# Patient Record
Sex: Male | Born: 1990 | Race: Black or African American | Hispanic: No | Marital: Single | Smoking: Former smoker
Health system: Southern US, Community
[De-identification: ages and names within clinical notes are randomized; demographics above are authoritative.]

---
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
## 2009-04-27 ENCOUNTER — Ambulatory Visit: Payer: Self-pay | Admitting: Internal Medicine

## 2009-05-17 ENCOUNTER — Telehealth (INDEPENDENT_AMBULATORY_CARE_PROVIDER_SITE_OTHER): Payer: Self-pay | Admitting: Internal Medicine

## 2009-05-17 LAB — CONVERTED CEMR LAB
Cholesterol: 217 mg/dL — ABNORMAL HIGH (ref 0–169)
LDL Cholesterol: 121 mg/dL — ABNORMAL HIGH (ref 0–109)
Triglycerides: 99 mg/dL (ref <150)

## 2009-05-20 ENCOUNTER — Ambulatory Visit: Payer: Self-pay | Admitting: Internal Medicine

## 2009-05-20 LAB — CONVERTED CEMR LAB: GC Probe Amp, Urine: NEGATIVE

## 2009-06-08 ENCOUNTER — Telehealth (INDEPENDENT_AMBULATORY_CARE_PROVIDER_SITE_OTHER): Payer: Self-pay | Admitting: Internal Medicine

## 2010-09-19 ENCOUNTER — Emergency Department (HOSPITAL_COMMUNITY)
Admission: EM | Admit: 2010-09-19 | Discharge: 2010-09-19 | Payer: Self-pay | Source: Home / Self Care | Admitting: Emergency Medicine

## 2011-06-26 LAB — GC/CHLAMYDIA PROBE AMP, GENITAL: Chlamydia, DNA Probe: POSITIVE — AB

## 2011-09-26 ENCOUNTER — Encounter: Payer: Self-pay | Admitting: *Deleted

## 2011-09-26 ENCOUNTER — Emergency Department (HOSPITAL_COMMUNITY)
Admission: EM | Admit: 2011-09-26 | Discharge: 2011-09-26 | Disposition: A | Discharge status: Home / Self Care | Payer: Self-pay | Attending: Emergency Medicine | Admitting: Emergency Medicine

## 2011-09-26 DIAGNOSIS — R6883 Chills (without fever): Secondary | ICD-10-CM | POA: Insufficient documentation

## 2011-09-26 DIAGNOSIS — F17200 Nicotine dependence, unspecified, uncomplicated: Secondary | ICD-10-CM | POA: Insufficient documentation

## 2011-09-26 DIAGNOSIS — R109 Unspecified abdominal pain: Secondary | ICD-10-CM | POA: Insufficient documentation

## 2011-09-26 DIAGNOSIS — R112 Nausea with vomiting, unspecified: Secondary | ICD-10-CM | POA: Insufficient documentation

## 2011-09-26 DIAGNOSIS — R10816 Epigastric abdominal tenderness: Secondary | ICD-10-CM | POA: Insufficient documentation

## 2011-09-26 MED ORDER — SODIUM CHLORIDE 0.9 % IV BOLUS (SEPSIS)
1000.0000 mL | Freq: Once | INTRAVENOUS | Status: AC
  Administered 2011-09-26: 1000 mL via INTRAVENOUS

## 2011-09-26 MED ORDER — ONDANSETRON HCL 4 MG/2ML IJ SOLN
4.0000 mg | Freq: Once | INTRAMUSCULAR | Status: AC
  Administered 2011-09-26: 4 mg via INTRAVENOUS
  Filled 2011-09-26: qty 2

## 2011-09-26 NOTE — ED Notes (Signed)
Pt reports genrealized abdominal pain, nausea, vomiting after drinking 1 mixed liquor drink last night.

## 2011-09-26 NOTE — ED Notes (Signed)
Pt is here with abd pain, chills, and vomiting.  Symptoms started at 0500.  Pt is having upper abd pain.  Pt is actively vomiting

## 2011-09-26 NOTE — ED Provider Notes (Signed)
History     CSN: [id number]  Arrival date & time 09/26/11  1106   First MD Initiated Contact with Patient 09/26/11 1224      Chief Complaint  Patient presents with  . Emesis    etoh  . Abdominal Pain    (Consider location/radiation/quality/duration/timing/severity/associated sxs/prior treatment) HPI History obtained from the patient. He states that he was drinking alcohol last evening; states that he only had one large mixed liquor drink. He began to experience abdominal pain, chills, vomiting this morning around 5 AM. His pain is located in the upper abdomen at the epigastrium. He has had recurrent vomiting during the day today, and estimates that he has vomited approximately 10 times today. He is generally healthy; denies chronic medical problems. Has not taken anything at home for his sx. He ate and drank normally yesterday; he has no recent hx of URI sx.   States he only drinks occasionally, only once every other week.  History reviewed. No pertinent past medical history.  History reviewed. No pertinent past surgical history.  No family history on file.  History  Substance Use Topics  . Smoking status: Current Everyday Smoker  . Smokeless tobacco: Not on file  . Alcohol Use: Yes     occ      Review of Systems  Constitutional: Negative for fever, chills, activity change, appetite change and unexpected weight change.  HENT: Negative.   Respiratory: Negative for cough, chest tightness and shortness of breath.   Cardiovascular: Negative for chest pain and palpitations.  Gastrointestinal: Positive for nausea, vomiting and abdominal pain. Negative for diarrhea.  Musculoskeletal: Negative for myalgias.  Skin: Negative for color change and rash.  Neurological: Negative for dizziness and weakness.    Allergies  Review of patient's allergies indicates no known allergies.  Home Medications  No current outpatient prescriptions on file.  BP 117/65  Pulse 78  Temp(Src)  98 F (36.7 C) (Oral)  Resp 16  SpO2 100%  Physical Exam  Nursing note and vitals reviewed. Constitutional: He is oriented to person, place, and time. He appears well-developed and well-nourished. No distress.  HENT:  Head: Normocephalic and atraumatic.  Eyes: Conjunctivae are normal.  Neck: Normal range of motion.  Cardiovascular: Normal rate and regular rhythm.   Pulmonary/Chest: Effort normal and breath sounds normal.  Abdominal: Soft. Bowel sounds are normal. There is tenderness. There is guarding. There is no rebound.       Moderate TTP and guarding to palpation over epigastrium; no rebound  Neurological: He is alert and oriented to person, place, and time.       mentating appropriately  Skin: Skin is warm and dry. He is not diaphoretic.    ED Course  Procedures (including critical care time)   Labs Reviewed  LIPASE, BLOOD   No results found.   1. Nausea & vomiting       MDM  As the pt was having epigastric tenderness with exam and had had recent etoh consumption, lipase was sent; this was nl. He felt much better with 1L of fluid and Zofran and vitals normalized. He was d/ced home. Return precautions discussed. Pt verbalized understanding and agreed to plan.       Grant Fontana, [place] 09/26/11 [phone]

## 2011-09-28 NOTE — ED Provider Notes (Signed)
History/physical exam/procedure(s) were performed by non-physician practitioner and as supervising physician I was immediately available for consultation/collaboration. I have reviewed all notes and am in agreement with care and plan.   Kenosha Doster S Thora Scherman, MD 09/28/11 1237 

## 2011-12-07 ENCOUNTER — Encounter (HOSPITAL_COMMUNITY): Payer: Self-pay | Admitting: Emergency Medicine

## 2011-12-07 ENCOUNTER — Emergency Department (HOSPITAL_COMMUNITY)
Admission: EM | Admit: 2011-12-07 | Discharge: 2011-12-07 | Disposition: A | Discharge status: Home / Self Care | Payer: Self-pay | Attending: Emergency Medicine | Admitting: Emergency Medicine

## 2011-12-07 DIAGNOSIS — H5789 Other specified disorders of eye and adnexa: Secondary | ICD-10-CM | POA: Insufficient documentation

## 2011-12-07 DIAGNOSIS — H547 Unspecified visual loss: Secondary | ICD-10-CM | POA: Insufficient documentation

## 2011-12-07 DIAGNOSIS — H109 Unspecified conjunctivitis: Secondary | ICD-10-CM | POA: Insufficient documentation

## 2011-12-07 DIAGNOSIS — H53149 Visual discomfort, unspecified: Secondary | ICD-10-CM | POA: Insufficient documentation

## 2011-12-07 DIAGNOSIS — H579 Unspecified disorder of eye and adnexa: Secondary | ICD-10-CM | POA: Insufficient documentation

## 2011-12-07 MED ORDER — FLUORESCEIN SODIUM 1 MG OP STRP
1.0000 | ORAL_STRIP | Freq: Once | OPHTHALMIC | Status: AC
  Administered 2011-12-07: 1 via OPHTHALMIC
  Filled 2011-12-07: qty 1

## 2011-12-07 MED ORDER — NAPHAZOLINE-PHENIRAMINE 0.025-0.3 % OP SOLN: 1.0000 [drp] | OPHTHALMIC | Status: AC | PRN

## 2011-12-07 MED ORDER — TETRACAINE HCL 0.5 % OP SOLN
1.0000 [drp] | Freq: Once | OPHTHALMIC | Status: AC
  Administered 2011-12-07: 1 [drp] via OPHTHALMIC
  Filled 2011-12-07: qty 2

## 2011-12-07 NOTE — ED Provider Notes (Signed)
History     CSN: [id number]  Arrival date & time 12/07/11  2007   First MD Initiated Contact with Patient 12/07/11 2235      Chief Complaint  Patient presents with  . Conjunctivitis    (Consider location/radiation/quality/duration/timing/severity/associated sxs/prior treatment) Patient is a 21 y.o. male presenting with conjunctivitis. The history is provided by the patient.  Conjunctivitis  The current episode started yesterday. The problem occurs continuously. The problem has been gradually worsening. The problem is moderate. The symptoms are relieved by nothing. The symptoms are aggravated by light and movement. Associated symptoms include decreased vision, eye itching, photophobia, eye discharge (Clear tearing, eye crusting in morning ) and eye redness. Pertinent negatives include no orthopnea, no fever, no double vision, no abdominal pain, no constipation, no diarrhea, no nausea, no vomiting, no congestion, no ear discharge, no ear pain, no headaches, no hearing loss, no mouth sores, no rhinorrhea, no sore throat, no stridor, no swollen glands, no muscle aches, no neck pain, no neck stiffness, no cough, no URI, no rash, no diaper rash and no eye pain. The eye pain is mild. There is pain in the right eye. The eye pain is not associated with movement. The eyelid exhibits no abnormality. He has been behaving normally. He has been eating and drinking normally. Urine output has been normal. The last void occurred less than 6 hours ago. Sick contacts: girlfriend had pink eye in both eyes last week.     History reviewed. No pertinent past medical history.  No past surgical history on file.  No family history on file.  History  Substance Use Topics  . Smoking status: Current Everyday Smoker  . Smokeless tobacco: Not on file  . Alcohol Use: Yes     occ      Review of Systems  Constitutional: Negative for fever.  HENT: Negative for hearing loss, ear pain, congestion, sore throat,  rhinorrhea, mouth sores, neck pain and ear discharge.   Eyes: Positive for photophobia, discharge (Clear tearing, eye crusting in morning ), redness and itching. Negative for double vision and pain.  Respiratory: Negative for cough and stridor.   Cardiovascular: Negative for orthopnea.  Gastrointestinal: Negative for nausea, vomiting, abdominal pain, diarrhea and constipation.  Skin: Negative for rash.  Neurological: Negative for headaches.  All other systems reviewed and are negative.    Allergies  Review of patient's allergies indicates no known allergies.  Home Medications   Current Outpatient Rx  Name Route Sig Dispense Refill  . IBUPROFEN 200 MG PO TABS Oral Take 400 mg by mouth every 8 (eight) hours as needed. For pain.    Marland Kitchen PRESCRIPTION MEDICATION Right Eye Place 1 drop into the right eye 2 (two) times daily. Girlfriend's eye drop for her case of conjunctivitis from last week.  Was unsure of name, thinks it begins with "Pol".      BP 136/63  Pulse 82  Temp(Src) 98.8 F (37.1 C) (Oral)  Resp 16  SpO2 100%  Physical Exam  Nursing note and vitals reviewed. Constitutional: He is oriented to person, place, and time. He appears well-developed and well-nourished. No distress.  HENT:  Head: Normocephalic and atraumatic.  Eyes: Conjunctivae and EOM are normal. Pupils are equal, round, and reactive to light. No foreign body present in the left eye.       No tenderness to palpation over temporal arteries or orbital region. Pain free EOMs, visual acuity equal bilaterally, no increase in IOPs, no proptosis, lid swelling, hyphema, purulent  discharge from eyes, or consensual photophobia.  Eyelids everted, no evidence of FB.  Fluorescein study: no corneal uptake, dendritic pattern, or evidence of corneal abrasions.  Neck: Normal range of motion. Neck supple.  Pulmonary/Chest: Effort normal.  Neurological: He is alert and oriented to person, place, and time.  Skin: Skin is warm and  dry. No rash noted. He is not diaphoretic.  Psychiatric: His behavior is normal.    ED Course  Procedures (including critical care time)  Labs Reviewed - No data to display No results found.   No diagnosis found.    MDM  Viral conjunctivitis   Pt dx likely viral conjunctivitis based on presentation & eye exam, no antibiotics are indicated.  No evidence of HSV or VSV infection. Pt is not a contact lens wearer.  Exam non-concerning for orbital cellulitis, hyphema, corneal ulcers, corneal abrasions or trauma.  Patient will be discharged home with naphazoline drops every one to 2 hours for pruritus.  Patient has been instructed to use cool compresses and practice personal hygiene with frequent handwashing.  Patient understands to followup with ophthalmology, especially if new symptoms including change in vision, purulent drainage, or entrapment occur.           Jaci Carrel, [place] 12/07/11 2332

## 2011-12-07 NOTE — ED Notes (Signed)
Pt with drainage from R eye. States his girlfriend had pink eye last week.

## 2011-12-07 NOTE — Discharge Instructions (Signed)
Listed in your discharge instructions is an opthalmologist (eye doctor) to schedule a follow up appointment with. Read the instructions below and make sure you understand reasons to return to the Emergency Department.   Conjunctivitis  Conjunctivitis is commonly called "pink eye." Conjunctivitis can be caused by bacterial or viral infection, allergies, or injuries. There is usually redness of the lining of the eye, itching, discomfort, and sometimes discharge. Pink eye is very contagious and spreads by direct contact.  You may be given antibiotic eyedrops as part of your treatment. Before using your eye medicine, remove all drainage from the eye by washing gently with warm water and cotton balls. Continue to use the medication until you have awakened 2 mornings in a row without discharge from the eye. Do not rub your eye. This increases the irritation and helps spread infection. Use separate towels from other household members. Wash your hands with soap and water before and after touching your eyes. Use cold compresses to reduce pain and sunglasses to relieve irritation from light. Do not wear contact lenses or wear eye makeup until the infection is gone.  SEEK MEDICAL CARE IF:  Your symptoms are not better after 3 days of treatment.  You have increased pain or trouble seeing.  The outer eyelids become very red or swollen.  You develop double vision or your vision becomes blurred or worsens in any way.  You have trouble moving your eyes.  The eye looks like it is popping out (proptosis).  You develop a severe headache, severe neck pain, or neck stiffness.  You develop repeated vomiting.  You have a fever or persistent symptoms for more than 72 hours.  You have a fever and your symptoms suddenly get worse.    RESOURCE GUIDE  Dental Problems  Patients with Medicaid: [facility]                     [facility] [address]                                            [address] Phone:  [phone]                                                  Phone:  [phone]  If unable to pay or uninsured, contact:  [facility] or [facility] to become qualified for the adult dental clinic.  Chronic Pain Problems Contact  [facility]  [phone] Patients need to be referred by their primary care doctor.  Insufficient Money for Medicine Contact [employer]:  call "211" or [facility] 271-5999.  No Primary Care Doctor Call [facility]  832-8000 Other agencies that provide inexpensive medical care    [facility]  832-8035    [facility]  832-7272    [facility]  [phone]    Women[facility] Clinic  832-4777    [facility]  [phone]    [facility]  272-1050  Psychological Services [facility]  [phone] [facility]  [phone] [facility]   800 853-5163 (emergency services 641-4993)  Substance Abuse Resources [facility]  336-882-2125   [facility] 336-784-9470 [facility] 336-285-9073 [facility] 336-845-3988 Residential & Outpatient Substance Abuse Program  800-659-3381  Abuse/Neglect [place] Child Abuse Hotline (336) 641-3795 [place] [facility] Abuse Hotline 800-378-5315 (After Hours)  Emergency Shelter [facility] (336) 271-5985  [facility] (336) 275-9566 [facility] (704) 372-4663  MRSA Hotline #:   832-7006    [place] Resources  [facility] [place]     [employer]                          [facility] [address] [place]                       [address]      [address]  [place]                                                [place]                            [place] Phone:  [phone]                                   Phone:   [phone]                 Phone:  [phone]  [facility] Phone:  [phone]  [place] Child Abuse Hotline (336) 342-1394 (336) 342-3537 (After Hours)     

## 2011-12-08 NOTE — ED Provider Notes (Signed)
Medical screening examination/treatment/procedure(s) were performed by non-physician practitioner and as supervising physician I was immediately available for consultation/collaboration.  Hurman Horn, MD 12/08/11 [phone]

## 2012-08-22 ENCOUNTER — Emergency Department (HOSPITAL_COMMUNITY)
Admission: EM | Admit: 2012-08-22 | Discharge: 2012-08-22 | Disposition: A | Discharge status: Home / Self Care | Payer: Self-pay | Attending: Emergency Medicine | Admitting: Emergency Medicine

## 2012-08-22 ENCOUNTER — Encounter (HOSPITAL_COMMUNITY): Payer: Self-pay | Admitting: *Deleted

## 2012-08-22 DIAGNOSIS — F17200 Nicotine dependence, unspecified, uncomplicated: Secondary | ICD-10-CM | POA: Insufficient documentation

## 2012-08-22 DIAGNOSIS — Z23 Encounter for immunization: Secondary | ICD-10-CM | POA: Insufficient documentation

## 2012-08-22 DIAGNOSIS — Y929 Unspecified place or not applicable: Secondary | ICD-10-CM | POA: Insufficient documentation

## 2012-08-22 DIAGNOSIS — Y9389 Activity, other specified: Secondary | ICD-10-CM | POA: Insufficient documentation

## 2012-08-22 DIAGNOSIS — S61419A Laceration without foreign body of unspecified hand, initial encounter: Secondary | ICD-10-CM

## 2012-08-22 DIAGNOSIS — S61209A Unspecified open wound of unspecified finger without damage to nail, initial encounter: Secondary | ICD-10-CM | POA: Insufficient documentation

## 2012-08-22 DIAGNOSIS — W268XXA Contact with other sharp object(s), not elsewhere classified, initial encounter: Secondary | ICD-10-CM | POA: Insufficient documentation

## 2012-08-22 MED ORDER — TETANUS-DIPHTH-ACELL PERTUSSIS 5-2.5-18.5 LF-MCG/0.5 IM SUSP
0.5000 mL | Freq: Once | INTRAMUSCULAR | Status: AC
  Administered 2012-08-22: 0.5 mL via INTRAMUSCULAR
  Filled 2012-08-22: qty 0.5

## 2012-08-22 NOTE — ED Provider Notes (Signed)
History  This chart was scribed for Benny Lennert, MD by Erskine Emery, ED Scribe. This patient was seen in room TR08C/TR08C and the patient's care was started at 16:47.   CSN: [id number]  Arrival date & time 08/22/12  1638   First MD Initiated Contact with Patient 08/22/12 1647      Chief Complaint  Patient presents with  . Laceration    (Consider location/radiation/quality/duration/timing/severity/associated sxs/prior Treatment) Marcus Zhang is a 21 y.o. male who presents to the Emergency Department complaining of a laceration to the right thumb after cutting it on the edge of a vegetable can. Pt has not yet been able to control the bleeding. Pt does not know when his last Tetanus shot was. Patient is a 21 y.o. male presenting with skin laceration. The history is provided by the patient. No language interpreter was used.  Laceration  The incident occurred less than 1 hour ago. Pain location: right thumb. The laceration mechanism was a a metal edge (a vegetable can). The pain is moderate. The pain has been constant since onset. He reports no foreign bodies present. His tetanus status is unknown.    History reviewed. No pertinent past medical history.  History reviewed. No pertinent past surgical history.  History reviewed. No pertinent family history.  History  Substance Use Topics  . Smoking status: Current Every Day Smoker  . Smokeless tobacco: Not on file  . Alcohol Use: Yes     Comment: occ      Review of Systems  Constitutional: Negative for fever and chills.  Respiratory: Negative for shortness of breath.   Gastrointestinal: Negative for nausea and vomiting.  Skin: Positive for wound (right thumb).  Neurological: Negative for weakness.    Allergies  Review of patient's allergies indicates no known allergies.  Home Medications  No current outpatient prescriptions on file.  BP 148/98  Pulse 88  Temp 98 F (36.7 C) (Oral)  Resp 20  SpO2  98%  Physical Exam  Nursing note and vitals reviewed. Constitutional: He is oriented to person, place, and time. He appears well-developed.  HENT:  Head: Normocephalic and atraumatic.  Eyes: Conjunctivae normal are normal.  Neck: No tracheal deviation present.  Cardiovascular:  No murmur heard. Pulmonary/Chest: Effort normal. He exhibits no tenderness.  Musculoskeletal: Normal range of motion.  Neurological: He is oriented to person, place, and time.  Skin: Skin is warm.       2cm laceration to ventral distal right thumb.  Psychiatric: He has a normal mood and affect.    ED Course  Procedures (including critical care time) DIAGNOSTIC STUDIES: Oxygen Saturation is 98% on room air, normal by my interpretation.    COORDINATION OF CARE: 16:50--I evaluated the patient and we discussed a treatment plan including laceration repair and antibiotics to which the pt agreed.  17:05--I performed laceration repair LACERATION REPAIR PROCEDURE NOTE The patient's identification was confirmed and consent was obtained. This procedure was performed by Benny Lennert, MD at 5:05 PM. Site: right thumb Sterile procedures observed: yes Anesthetic used (type and amt): 2% without epi Suture type/size:4-0 nylon Length: 2 cm # of Sutures: 3 Technique: interrupted Complexity: simple Tetanus UTD or ordered: Tetanus ordered Site anesthetized, irrigated with NS, explored without evidence of foreign body, wound well approximated, site covered with dry, sterile dressing.  Patient tolerated procedure well without complications. Instructions for care discussed verbally and patient provided with additional written instructions for homecare and f/u.  I notified the pt that he needs  to keep the sutures in for over a week and then he can see his PCP, urgent care, or came back here to have them removed. I told him we would be discharging him with antibiotics to make sure the area doesn't get  infected.    Labs Reviewed - No data to display No results found.   No diagnosis found.    MDM  The chart was scribed for me under my direct supervision.  I personally performed the history, physical, and medical decision making and all procedures in the evaluation of this patient.Benny Lennert, MD 08/22/12 [phone]

## 2012-08-22 NOTE — ED Notes (Signed)
Patient injured his right thumb.  Laceration to the right thumb from a vegetable can

## 2014-02-02 ENCOUNTER — Emergency Department (HOSPITAL_COMMUNITY): Payer: Self-pay

## 2014-02-02 ENCOUNTER — Emergency Department (HOSPITAL_COMMUNITY)
Admission: EM | Admit: 2014-02-02 | Discharge: 2014-02-02 | Disposition: A | Discharge status: Home / Self Care | Payer: Self-pay | Attending: Emergency Medicine | Admitting: Emergency Medicine

## 2014-02-02 ENCOUNTER — Encounter (HOSPITAL_COMMUNITY): Payer: Self-pay | Admitting: Emergency Medicine

## 2014-02-02 DIAGNOSIS — F17200 Nicotine dependence, unspecified, uncomplicated: Secondary | ICD-10-CM | POA: Insufficient documentation

## 2014-02-02 DIAGNOSIS — Y9389 Activity, other specified: Secondary | ICD-10-CM | POA: Insufficient documentation

## 2014-02-02 DIAGNOSIS — Y929 Unspecified place or not applicable: Secondary | ICD-10-CM | POA: Insufficient documentation

## 2014-02-02 DIAGNOSIS — S60221A Contusion of right hand, initial encounter: Secondary | ICD-10-CM

## 2014-02-02 DIAGNOSIS — W2209XA Striking against other stationary object, initial encounter: Secondary | ICD-10-CM | POA: Insufficient documentation

## 2014-02-02 DIAGNOSIS — S60229A Contusion of unspecified hand, initial encounter: Secondary | ICD-10-CM | POA: Insufficient documentation

## 2014-02-02 MED ORDER — IBUPROFEN 600 MG PO TABS: 600.0000 mg | ORAL_TABLET | Freq: Four times a day (QID) | ORAL | Status: DC | PRN

## 2014-02-02 MED ORDER — HYDROCODONE-ACETAMINOPHEN 5-325 MG PO TABS: 1.0000 | ORAL_TABLET | ORAL | Status: DC | PRN

## 2014-02-02 NOTE — ED Notes (Signed)
Pt was "horsing around" with girlfriend last night when he injured R hand in process. Presents today with painful and swollen R hand.

## 2014-02-02 NOTE — Discharge Instructions (Signed)
Apply ice to the area, elevate and take the medication as directed. Do not take the narcotic if you are driving as it will make you sleepy.

## 2014-02-02 NOTE — ED Provider Notes (Signed)
CSN: [id number]     Arrival date & time 02/02/14  1448 History  This chart was scribed for non-physician practitioner, Kerrie BuffaloHope Neese, FNP,working with Donnetta HutchingBrian Cook, MD, by Karle PlumberJennifer Tensley, ED Scribe.  This patient was seen in room APFT23/APFT23 and the patient's care was started at 3:06 PM.  Chief Complaint  Patient presents with  . Hand Pain   The history is provided by the patient. No language interpreter was used.   HPI Comments:  Marcus Zhang is a 23 y.o. male who presents to the Emergency Department complaining of sharp shooting pain to his right hand that started approximately 15 hours ago secondary to smashing it on the corner of a wall while playing around. He reports the dorsal aspect of his hand started swelling immediately and upon waking this morning he had pain that radiated to his wrist. Pt states he has neither taken anything for pain nor iced or elevated the hand. He denies numbness, tingling, or any other issues.   History reviewed. No pertinent past medical history. History reviewed. No pertinent past surgical history. History reviewed. No pertinent family history. History  Substance Use Topics  . Smoking status: Current Every Day Smoker  . Smokeless tobacco: Not on file  . Alcohol Use: Yes     Comment: occ    Review of Systems  Musculoskeletal: Arthralgias: right hand.  Neurological: Negative for numbness.  All other systems reviewed and are negative.   Allergies  Review of patient's allergies indicates no known allergies.  Home Medications   Prior to Admission medications   Not on File   Triage Vitals: BP 131/74  Pulse 73  Resp 18  Ht 5\' 7"  (1.702 m)  Wt 230 lb (104.327 kg)  BMI 36.01 kg/m2  SpO2 100% Physical Exam  Nursing note and vitals reviewed. Constitutional: He is oriented to person, place, and time. He appears well-developed and well-nourished.  Eyes: EOM are normal.  Neck: Neck supple.  Cardiovascular:  Good radial pulse.   Pulmonary/Chest: Effort normal.  Abdominal: Soft. There is no tenderness.  Musculoskeletal: Normal range of motion. He exhibits edema and tenderness.  Swelling to dorsal aspect at base of middle finger of right hand.  Neurological: He is alert and oriented to person, place, and time. No cranial nerve deficit.  Skin: Skin is warm and dry.    ED Course  Procedures (including critical care time) DIAGNOSTIC STUDIES: Oxygen Saturation is 100% on RA, normal by my interpretation.   COORDINATION OF CARE: 3:09 PM- Will X-Ray right hand. Pt verbalizes understanding and agrees to plan.  Medications - No data to display  Labs Review Labs Reviewed - No data to display  Imaging Review Dg Hand Complete Right  02/02/2014   CLINICAL DATA:  RIGHT hand pain and swelling posteriorly near middle finger, injury, hit a wall last night  EXAM: RIGHT HAND - COMPLETE 3+ VIEW  COMPARISON:  None  FINDINGS: Soft tissue swelling dorsally overlying the metacarpals and MCP joints.  Osseous mineralization normal.  Joint spaces preserved.  No acute fracture, dislocation or bone destruction.  IMPRESSION: No acute osseous abnormalities.   Electronically Signed   By: Ulyses SouthwardMark  Boles M.D.   On: 02/02/2014 15:33    MDM  23 y.o. male with swelling and tenderness to the right hand s/p injury last night. Ace wrap applied, ice, elevation and pain management. He will return for any problems. I have reviewed this patient's vital signs, nurses notes, imaging and discussed findings and plan of care  with the patient. He voices understanding and agrees to plan.       [facility] Orlene OchM Neese, [place] 02/02/14 [phone]

## 2014-02-03 NOTE — ED Provider Notes (Signed)
Medical screening examination/treatment/procedure(s) were performed by non-physician practitioner and as supervising physician I was immediately available for consultation/collaboration.   EKG Interpretation None        Dandra Velardi W Safa Derner, MD 02/03/14 1238 

## 2014-03-31 ENCOUNTER — Encounter (HOSPITAL_COMMUNITY): Payer: Self-pay | Admitting: Emergency Medicine

## 2014-03-31 ENCOUNTER — Emergency Department (HOSPITAL_COMMUNITY)
Admission: EM | Admit: 2014-03-31 | Discharge: 2014-03-31 | Disposition: A | Discharge status: Home / Self Care | Payer: Self-pay | Attending: Emergency Medicine | Admitting: Emergency Medicine

## 2014-03-31 ENCOUNTER — Emergency Department (HOSPITAL_COMMUNITY): Payer: Self-pay

## 2014-03-31 DIAGNOSIS — W230XXA Caught, crushed, jammed, or pinched between moving objects, initial encounter: Secondary | ICD-10-CM | POA: Insufficient documentation

## 2014-03-31 DIAGNOSIS — Y929 Unspecified place or not applicable: Secondary | ICD-10-CM | POA: Insufficient documentation

## 2014-03-31 DIAGNOSIS — S62609A Fracture of unspecified phalanx of unspecified finger, initial encounter for closed fracture: Secondary | ICD-10-CM | POA: Insufficient documentation

## 2014-03-31 DIAGNOSIS — F17200 Nicotine dependence, unspecified, uncomplicated: Secondary | ICD-10-CM | POA: Insufficient documentation

## 2014-03-31 DIAGNOSIS — S62605A Fracture of unspecified phalanx of left ring finger, initial encounter for closed fracture: Secondary | ICD-10-CM

## 2014-03-31 DIAGNOSIS — Y9383 Activity, rough housing and horseplay: Secondary | ICD-10-CM | POA: Insufficient documentation

## 2014-03-31 MED ORDER — HYDROCODONE-ACETAMINOPHEN 5-325 MG PO TABS: 1.0000 | ORAL_TABLET | Freq: Four times a day (QID) | ORAL | Status: DC | PRN

## 2014-03-31 MED ORDER — IBUPROFEN 800 MG PO TABS
800.0000 mg | ORAL_TABLET | Freq: Once | ORAL | Status: AC
  Administered 2014-03-31: 800 mg via ORAL
  Filled 2014-03-31: qty 1

## 2014-03-31 MED ORDER — IBUPROFEN 800 MG PO TABS: 800.0000 mg | ORAL_TABLET | Freq: Three times a day (TID) | ORAL | Status: DC | PRN

## 2014-03-31 NOTE — Progress Notes (Signed)
[facility] did not get to see patient but will be sending information about [facility] Orange Card program to help patient establish primary care, using the address provided.  °

## 2014-03-31 NOTE — ED Provider Notes (Signed)
CSN: [id number]     Arrival date & time 03/31/14  0808 History   First MD Initiated Contact with Patient 03/31/14 [phone]     Chief Complaint  Patient presents with  . Finger Injury     (Consider location/radiation/quality/duration/timing/severity/associated sxs/prior Treatment) Patient is a 23 y.o. male presenting with hand injury. The history is provided by the patient.  Hand Injury Location:  Finger Injury: yes   Mechanism of injury comment:  Finger caught under deck during some rough-housing Finger location:  L ring finger Pain details:    Quality:  Aching   Radiates to:  Does not radiate   Severity:  Moderate   Onset quality:  Sudden   Timing:  Constant   Progression:  Unchanged Chronicity:  New Handedness:  Right-handed Dislocation: no   Prior injury to area:  No Relieved by:  Nothing Ineffective treatments:  Ice Associated symptoms: no fever     History reviewed. No pertinent past medical history. History reviewed. No pertinent past surgical history. No family history on file. History  Substance Use Topics  . Smoking status: Current Every Day Smoker    Types: Cigarettes  . Smokeless tobacco: Not on file  . Alcohol Use: Yes     Comment: occ    Review of Systems  Constitutional: Negative for fever.  Respiratory: Negative for cough and shortness of breath.   All other systems reviewed and are negative.     Allergies  Review of patient's allergies indicates no known allergies.  Home Medications   Prior to Admission medications   Not on File   BP 149/87  Pulse 86  Temp(Src) 98.1 F (36.7 C) (Oral)  Resp 16  SpO2 100% Physical Exam  Nursing note and vitals reviewed. Constitutional: He is oriented to person, place, and time. He appears well-developed and well-nourished. No distress.  HENT:  Head: Normocephalic and atraumatic.  Mouth/Throat: Oropharynx is clear and moist. No oropharyngeal exudate.  Eyes: EOM are normal. Pupils are equal, round, and  reactive to light.  Neck: Normal range of motion. Neck supple.  Cardiovascular: Normal rate and regular rhythm.  Exam reveals no friction rub.   No murmur heard. Pulmonary/Chest: Effort normal and breath sounds normal. No respiratory distress. He has no wheezes. He has no rales.  Abdominal: He exhibits no distension. There is no tenderness. There is no rebound.  Musculoskeletal: Normal range of motion. He exhibits no edema.       Right hand: He exhibits tenderness (mild, 3rd MCP) and bony tenderness. He exhibits normal range of motion, normal capillary refill, no deformity, no laceration and no swelling. Normal sensation noted. Normal strength noted.       Left hand: He exhibits tenderness and bony tenderness (4th PIP, 4th finger proximal phalanx). He exhibits normal range of motion, normal capillary refill, no deformity and no swelling. Normal sensation noted. Normal strength noted.  Neurological: He is alert and oriented to person, place, and time.  Skin: He is not diaphoretic.    ED Course  Procedures (including critical care time) Labs Review Labs Reviewed - No data to display  Imaging Review Dg Hand Complete Right  03/31/2014   CLINICAL DATA:  Injury of the right hand 1 month ago with persistent pain centered in the third metacarpophalangeal joint  EXAM: RIGHT HAND - COMPLETE 3+ VIEW  COMPARISON:  None.  FINDINGS: The bones of the right hand are adequately mineralized. There is no acute or healing fracture. Specific attention to the third ray reveals  no bony abnormality. There is soft tissue swelling over the metacarpal region.  IMPRESSION: There is no evidence of an acute or healing fracture of the right hand. There is mild persists soft tissue swelling over the metacarpal regions dorsally.   Electronically Signed   By: David  SwazilandJordan   On: 03/31/2014 09:13   Dg Finger Ring Left  03/31/2014   CLINICAL DATA:  Pain post trauma  EXAM: LEFT FOURTH FINGER 2+V  COMPARISON:  None.  FINDINGS:  Frontal, oblique, and lateral views were obtained. There is a small avulsion along the volar aspect of the fourth PIP joint, probably arising from the proximal portion of the fourth middle phalanx. No other evidence of fracture. No dislocation. There is soft tissue swelling. There is no appreciable joint space narrowing.  IMPRESSION: Small avulsion in the volar aspect of the fourth PIP joint. No other evidence of fracture. No dislocation. No appreciable arthropathy.   Electronically Signed   By: Bretta BangWilliam  Woodruff M.D.   On: 03/31/2014 09:14     EKG Interpretation None      MDM   Final diagnoses:  Fracture of fourth finger, left, closed, initial encounter    23 year old male presents with a finger injury. Happened all he is playing with his kids on the deck. He thinks his left ring finger got caught underneath a deck. No laceration. Pain is not controlled with ice. He has full range of motion of his left index finger with good sensation good cap refill. It does hurt with flexion and extension. No deformity or dislocation. Will x-ray. Likely strain versus sprain. Also having right hand pain. Injured his right third finger one month ago, was seen at a dependent that time. CT vertebral plana with that also. No deformity or laceration. Good cap refill good range of motion of all fingers. All fingers are vastly intact. Likely strain also. Will x-ray him.  Patient unable to go to work today, will give work note. L 4th finger xray with small avulsion fx - splinted, will give note for light duty, Hand surgery f/u. Stable for discharge. I have reviewed all labs and imaging and considered them in my medical decision making.   Dagmar HaitWilliam Zakiya Sporrer, MD 03/31/14 [phone]

## 2014-03-31 NOTE — Discharge Instructions (Signed)

## 2014-03-31 NOTE — ED Notes (Signed)
Pt was playing around his siblings yesterday and hurt his ring finger on the deck. Pt states that after icing it yesterday the swelling has gone down but still sore. Pt can flex his finger. 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 Pt c/o pain and swelling on top of right hand.  Pt states that he injured it about a month ago and was [facility].

## 2014-04-07 ENCOUNTER — Encounter (HOSPITAL_COMMUNITY): Payer: Self-pay | Admitting: Emergency Medicine

## 2014-04-07 ENCOUNTER — Emergency Department (HOSPITAL_COMMUNITY)
Admission: EM | Admit: 2014-04-07 | Discharge: 2014-04-07 | Disposition: A | Discharge status: Home / Self Care | Payer: Self-pay | Attending: Emergency Medicine | Admitting: Emergency Medicine

## 2014-04-07 DIAGNOSIS — S6992XD Unspecified injury of left wrist, hand and finger(s), subsequent encounter: Secondary | ICD-10-CM

## 2014-04-07 DIAGNOSIS — Z8781 Personal history of (healed) traumatic fracture: Secondary | ICD-10-CM | POA: Insufficient documentation

## 2014-04-07 DIAGNOSIS — Z79899 Other long term (current) drug therapy: Secondary | ICD-10-CM | POA: Insufficient documentation

## 2014-04-07 DIAGNOSIS — M79609 Pain in unspecified limb: Secondary | ICD-10-CM | POA: Insufficient documentation

## 2014-04-07 DIAGNOSIS — F17200 Nicotine dependence, unspecified, uncomplicated: Secondary | ICD-10-CM | POA: Insufficient documentation

## 2014-04-07 DIAGNOSIS — Z0289 Encounter for other administrative examinations: Secondary | ICD-10-CM | POA: Insufficient documentation

## 2014-04-07 NOTE — Progress Notes (Signed)
[facility] provided pt with a list of primary care resources and a [facility] [employer] Card application to help patient establish primary care.  °

## 2014-04-07 NOTE — ED Provider Notes (Signed)
CSN: [id number]     Arrival date & time 04/07/14  1344 History  This chart was scribed for non-physician practitioner Fayrene HelperBowie Kaedin Hicklin, working with Toy BakerAnthony T Allen, MD by Carl Bestelina Holson, ED Scribe. This patient was seen in room WTR5/WTR5 and the patient's care was started at 2:49 PM.    Chief Complaint  Patient presents with  . Finger Injury    requesting note to return to work    The history is provided by the patient. No language interpreter was used.   HPI Comments: Marcus Zhang is a 23 y.o. male who presents to the Emergency Department needing a note to clear him for work.  The patient has a history of fracture to his left ring finger a week ago while playing with his little sister.  [facility] and was diagnosed with a small fracture to his left ring finger.  He states that he used a splint for one week and did not follow-up with an orthopedist even though he was provided with a referral for one.  He states that he was placed on light duty for 10 days at work.  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  Pt is here stating he feels much better, with minimal pain and wants to return to work with full duty.     History reviewed. No pertinent past medical history. History reviewed. No pertinent past surgical history. History reviewed. No pertinent family history. History  Substance Use Topics  . Smoking status: Current Every Day Smoker    Types: Cigarettes  . Smokeless tobacco: Not on file  . Alcohol Use: Yes     Comment: occ    Review of Systems  Musculoskeletal: Negative for arthralgias and joint swelling.  Skin: Negative for wound.  All other systems reviewed and are negative.     Allergies  Review of patient's allergies indicates no known allergies.  Home Medications   Prior to Admission medications   Medication Sig Start Date End Date Taking? Authorizing Provider  HYDROcodone-acetaminophen (NORCO/VICODIN) 5-325 MG per tablet  Take 1 tablet by mouth every 6 (six) hours as needed for moderate pain. 03/31/14   Dagmar HaitWilliam Blair Walden, MD  ibuprofen (ADVIL,MOTRIN) 800 MG tablet Take 1 tablet (800 mg total) by mouth every 8 (eight) hours as needed for moderate pain. 03/31/14   Dagmar HaitWilliam Blair Walden, MD   Triage Vitals: BP 102/90  Pulse 69  Temp(Src) 98.1 F (36.7 C) (Oral)  Resp 16  SpO2 100%  Physical Exam  Nursing note and vitals reviewed. Constitutional: He is oriented to person, place, and time. He appears well-developed and well-nourished.  HENT:  Head: Normocephalic and atraumatic.  Eyes: EOM are normal.  Neck: Normal range of motion.  Cardiovascular: Normal rate.   Pulmonary/Chest: Effort normal.  Musculoskeletal: Normal range of motion.  Left hand ring finger: mild tenderness to the PIP on palpation.  Full range of motion through the finger at all joints with a normal grip.  Sensation intact.  Brisk capillary refill.   Neurological: He is alert and oriented to person, place, and time.  Skin: Skin is warm and dry.  Psychiatric: He has a normal mood and affect. His behavior is normal.    ED Course  Procedures (including critical care time)  DIAGNOSTIC STUDIES: Oxygen Saturation is 100% on room air, normal by my interpretation.    COORDINATION OF CARE: 2:53 PM- Discussed discharging the patient with a note clearing him to  work and the patient agreed to the treatment plan.   Labs Review Labs Reviewed - No data to display  Imaging Review No results found.   EKG Interpretation None      MDM   Final diagnoses:  Injury of left ring finger, subsequent encounter    BP 102/90  Pulse 69  Temp(Src) 98.1 F (36.7 C) (Oral)  Resp 16  SpO2 100%   I personally performed the services described in this documentation, which was scribed in my presence. The recorded information has been reviewed and is accurate.     Fayrene Helper, PA-C 04/07/14 1456

## 2014-04-07 NOTE — ED Notes (Addendum)
Hx of fracture of ring finger of l/hand. Used splint for one week. Pt is requesting a return to work note, clearing him for work. Pt was referred to ortho, but did not follow up

## 2014-04-07 NOTE — Discharge Instructions (Signed)
You may resume your normal work duty.  Follow up with hand specialist as needed.

## 2014-04-08 NOTE — ED Provider Notes (Signed)
Medical screening examination/treatment/procedure(s) were performed by non-physician practitioner and as supervising physician I was immediately available for consultation/collaboration.   EKG Interpretation None       Toy BakerAnthony T Alonie Gazzola, MD 04/08/14 1510

## 2019-08-17 ENCOUNTER — Ambulatory Visit (INDEPENDENT_AMBULATORY_CARE_PROVIDER_SITE_OTHER): Payer: Self-pay

## 2019-08-17 ENCOUNTER — Ambulatory Visit (HOSPITAL_COMMUNITY)
Admission: EM | Admit: 2019-08-17 | Discharge: 2019-08-17 | Disposition: A | Discharge status: Home / Self Care | Payer: Self-pay | Attending: Family Medicine | Admitting: Family Medicine

## 2019-08-17 ENCOUNTER — Encounter (HOSPITAL_COMMUNITY): Payer: Self-pay

## 2019-08-17 ENCOUNTER — Other Ambulatory Visit: Payer: Self-pay

## 2019-08-17 DIAGNOSIS — S161XXA Strain of muscle, fascia and tendon at neck level, initial encounter: Secondary | ICD-10-CM
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
## 2019-08-17 NOTE — Discharge Instructions (Addendum)
May use muscle relaxant that I gave his partner
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
## 2019-11-13 ENCOUNTER — Encounter (HOSPITAL_COMMUNITY): Payer: Self-pay

## 2019-11-13 ENCOUNTER — Other Ambulatory Visit: Payer: Self-pay

## 2019-11-13 ENCOUNTER — Emergency Department (HOSPITAL_COMMUNITY)
Admission: EM | Admit: 2019-11-13 | Discharge: 2019-11-13 | Disposition: A | Discharge status: Home / Self Care | Payer: Self-pay | Attending: Emergency Medicine | Admitting: Emergency Medicine

## 2019-11-13 DIAGNOSIS — Y929 Unspecified place or not applicable: Secondary | ICD-10-CM | POA: Insufficient documentation

## 2019-11-13 DIAGNOSIS — Z87891 Personal history of nicotine dependence: Secondary | ICD-10-CM | POA: Insufficient documentation

## 2019-11-13 DIAGNOSIS — Y9389 Activity, other specified: Secondary | ICD-10-CM | POA: Insufficient documentation

## 2019-11-13 DIAGNOSIS — W504XXA Accidental scratch by another person, initial encounter: Secondary | ICD-10-CM | POA: Insufficient documentation

## 2019-11-13 DIAGNOSIS — Y999 Unspecified external cause status: Secondary | ICD-10-CM | POA: Insufficient documentation

## 2019-11-13 DIAGNOSIS — S3994XA Unspecified injury of external genitals, initial encounter: Secondary | ICD-10-CM | POA: Insufficient documentation

## 2019-11-13 MED ORDER — BACITRACIN ZINC 500 UNIT/GM EX OINT
1.0000 "application " | TOPICAL_OINTMENT | Freq: Once | CUTANEOUS | Status: AC
  Administered 2019-11-13: 1 via TOPICAL
  Filled 2019-11-13: qty 0.9

## 2019-11-13 NOTE — ED Triage Notes (Signed)
Patient arrived stating that on Saturday night he was having intercourse and had a cut on the tip of his penis. Reporting minimal pain at the time but is now having drainage from site.
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
## 2019-11-13 NOTE — Discharge Instructions (Signed)
Can continue using bacitracin on the area.  Refrain from sexual activity until area is fully healed. You will be contacted with results of STD testing if positive. Return here for any new/acute changes.

## 2019-11-13 NOTE — ED Notes (Signed)
PT DISCHARGED. INSTRUCTIONS GIVEN. AAOX4. PT IN NO APPARENT DISTRESS OR PAIN. THE OPPORTUNITY TO ASK QUESTIONS WAS PROVIDED. 

## 2019-11-14 LAB — GC/CHLAMYDIA PROBE AMP (~~LOC~~) NOT AT ARMC
Chlamydia: NEGATIVE
Neisseria Gonorrhea: POSITIVE — AB

## 2019-11-18 ENCOUNTER — Telehealth (HOSPITAL_COMMUNITY): Payer: Self-pay

## 2020-02-19 ENCOUNTER — Encounter (HOSPITAL_COMMUNITY): Payer: Self-pay | Admitting: Emergency Medicine

## 2020-02-19 ENCOUNTER — Other Ambulatory Visit: Payer: Self-pay

## 2020-02-19 ENCOUNTER — Emergency Department (HOSPITAL_COMMUNITY)
Admission: EM | Admit: 2020-02-19 | Discharge: 2020-02-20 | Disposition: A | Discharge status: Home / Self Care | Payer: Self-pay | Attending: Emergency Medicine | Admitting: Emergency Medicine

## 2020-02-19 DIAGNOSIS — Z79899 Other long term (current) drug therapy: Secondary | ICD-10-CM | POA: Insufficient documentation

## 2020-02-19 DIAGNOSIS — Z87891 Personal history of nicotine dependence: Secondary | ICD-10-CM | POA: Insufficient documentation

## 2020-02-19 DIAGNOSIS — H6692 Otitis media, unspecified, left ear: Secondary | ICD-10-CM | POA: Insufficient documentation

## 2020-02-19 MED ORDER — ACETAMINOPHEN 500 MG PO TABS
1000.0000 mg | ORAL_TABLET | Freq: Once | ORAL | Status: AC
  Administered 2020-02-20: 1000 mg via ORAL
  Filled 2020-02-19: qty 2

## 2020-02-19 MED ORDER — AMOXICILLIN 500 MG PO CAPS
500.0000 mg | ORAL_CAPSULE | Freq: Once | ORAL | Status: AC
  Administered 2020-02-19: 500 mg via ORAL
  Filled 2020-02-19: qty 1

## 2020-02-19 NOTE — ED Triage Notes (Addendum)
Patient arrives to ED with complaints of left ear pain since Friday. Patient states he feels a lot of pressure and feels like it needs to pop. Patient states he is now having headaches because of the pain.

## 2020-02-19 NOTE — ED Provider Notes (Signed)
[facility] EMERGENCY DEPARTMENT Provider Note   CSN: [id number] Arrival date & time: 02/19/20  2108     History Chief Complaint  Patient presents with  . Ear Pain  . Headache    Marcus Zhang is a 29 y.o. male.  The history is provided by the patient and medical records.  Headache Associated symptoms: ear pain     29 y.o. M here with 6 days of left sided ear pain.  States symptoms have been worsening since onset.  He reports associated left sided headache, muffled hearing, and sensation of swelling inside the ear.  Denies swimming or other head submersion.  No fevers.  No meds tried PTA.  History reviewed. No pertinent past medical history.  Patient Active Problem List   Diagnosis Date Noted  . WEIGHT LOSS 01/09/2008  . OBESITY 06/16/2007  . TOBACCO ABUSE 06/16/2007  . BEHAVIOR PROBLEM 06/16/2007  . Pure hypercholesterolemia 12/14/2006  . MARIJUANA ABUSE 12/14/2006    History reviewed. No pertinent surgical history.     Family History  Problem Relation Age of Onset  . Hypertension Mother   . Healthy Father     Social History   Tobacco Use  . Smoking status: Former Smoker    Types: Cigarettes    Quit date: 08/16/2016    Years since quitting: 3.5  . Smokeless tobacco: Never Used  Substance Use Topics  . Alcohol use: Yes    Comment: occ  . Drug use: No    Home Medications Prior to Admission medications   Medication Sig Start Date End Date Taking? Authorizing Provider  HYDROcodone-acetaminophen (NORCO/VICODIN) 5-325 MG per tablet Take 1 tablet by mouth every 6 (six) hours as needed for moderate pain.    [provider]  ibuprofen (ADVIL,MOTRIN) 800 MG tablet Take 800 mg by mouth every 8 (eight) hours as needed for moderate pain.    [provider]    Allergies    Patient has no known allergies.  Review of Systems   Review of Systems  HENT: Positive for ear pain.   Neurological: Positive for headaches.  All  other systems reviewed and are negative.   Physical Exam Updated Vital Signs BP 140/88   Pulse 79   Temp 98.6 F (37 C)   Resp 17   Ht 5\' 6"  (1.676 m)   Wt 117.9 kg   SpO2 98%   BMI 41.97 kg/m   Physical Exam Vitals and nursing note reviewed.  Constitutional:      General: He is not in acute distress.    Appearance: He is well-developed. He is not diaphoretic.  HENT:     Head: Normocephalic and atraumatic.     Right Ear: External ear normal.     Left Ear: External ear normal.     Ears:     Comments: Left EAC and TM erythematous, no signs of rupture, no drainage Right ear normal Eyes:     Conjunctiva/sclera: Conjunctivae normal.     Pupils: Pupils are equal, round, and reactive to light.  Neck:     Comments: No rigidity, no meningismus Cardiovascular:     Rate and Rhythm: Normal rate and regular rhythm.     Heart sounds: Normal heart sounds. No murmur.  Pulmonary:     Effort: Pulmonary effort is normal. No respiratory distress.     Breath sounds: Normal breath sounds. No wheezing or rhonchi.  Abdominal:     General: Bowel sounds are normal.  Palpations: Abdomen is soft.     Tenderness: There is no abdominal tenderness. There is no guarding.  Musculoskeletal:        General: Normal range of motion.     Cervical back: Full passive range of motion without pain, normal range of motion and neck supple. No rigidity.  Skin:    General: Skin is warm and dry.     Findings: No rash.  Neurological:     Mental Status: He is alert and oriented to person, place, and time.     Cranial Nerves: No cranial nerve deficit.     Sensory: No sensory deficit.     Motor: No tremor or seizure activity.     Comments: AAOx3, answering questions and following commands appropriately; equal strength UE and LE bilaterally; CN grossly intact; moves all extremities appropriately without ataxia; no focal neuro deficits or facial asymmetry appreciated  Psychiatric:        Behavior: Behavior  normal.        Thought Content: Thought content normal.     ED Results / Procedures / Treatments   Labs (all labs ordered are listed, but only abnormal results are displayed) Labs Reviewed - No data to display  EKG None  Radiology No results found.  Procedures Procedures (including critical care time)  Medications Ordered in ED Medications - No data to display  ED Course  I have reviewed the triage vital signs and the nursing notes.  Pertinent labs & imaging results that were available during my care of the patient were reviewed by me and considered in my medical decision making (see chart for details).    MDM Rules/Calculators/A&P  29 y.o. M here with left ear pain.  Has associated headache and muffled hearing.  He is afebrile, non-toxic.  Has findings of left OM on exam.  Suspect this is causing headache. No focal neurologic deficits or clinical signs of meningitis. Will treat with course of amoxicillin, encouraged tylenol or motrin for pain.  Return here for any new/acute changes.  Final Clinical Impression(s) / ED Diagnoses Final diagnoses:  Acute infection of left ear    Rx / DC Orders ED Discharge Orders         Ordered    amoxicillin (AMOXIL) 500 MG capsule  3 times daily     02/20/20 0000           Garlon Hatchet, PA-C 02/20/20 Belva Crome, MD 02/21/20 [phone]

## 2020-02-20 MED ORDER — AMOXICILLIN 500 MG PO CAPS
500.0000 mg | ORAL_CAPSULE | Freq: Three times a day (TID) | ORAL | 0 refills | Status: DC
Start: 2020-02-20 — End: 2021-01-05

## 2020-02-20 NOTE — Discharge Instructions (Signed)
Remainder of antibiotics sent to your pharmacy, take as directed and make sure to finish the whole course of treatment.  Can take tylenol or motrin for pain. Return to the ED for new or worsening symptoms.

## 2020-10-12 ENCOUNTER — Ambulatory Visit: Admit: 2020-10-12 | Discharge status: Against Medical Advice | Payer: Medicaid Other

## 2020-10-14 ENCOUNTER — Ambulatory Visit: Payer: Self-pay | Admitting: Medical

## 2020-10-20 ENCOUNTER — Telehealth: Payer: Medicaid Other | Admitting: Family Medicine

## 2020-10-25 ENCOUNTER — Ambulatory Visit: Payer: Medicaid Other | Admitting: Family Medicine

## 2021-01-05 ENCOUNTER — Encounter: Payer: Self-pay | Admitting: Family

## 2021-01-05 ENCOUNTER — Telehealth: Payer: Medicaid Other | Admitting: Family

## 2021-01-05 DIAGNOSIS — J301 Allergic rhinitis due to pollen: Secondary | ICD-10-CM

## 2021-01-05 MED ORDER — CETIRIZINE HCL 10 MG PO TABS: 10.0000 mg | ORAL_TABLET | Freq: Every day | ORAL | 11 refills | Status: DC

## 2021-01-05 MED ORDER — FLUTICASONE PROPIONATE 50 MCG/ACT NA SUSP: 2.0000 | Freq: Every day | NASAL | 6 refills | Status: DC

## 2021-01-05 NOTE — Progress Notes (Signed)
   Virtual Visit  Note Due to COVID-19 pandemic this visit was conducted virtually. This visit type was conducted due to national recommendations for restrictions regarding the COVID-19 Pandemic (e.g. social distancing, sheltering in place) in an effort to limit this patient's exposure and mitigate transmission in our community. All issues noted in this document were discussed and addressed.  A physical exam was not performed with this format.  I connected with Marcus Zhang on 01/05/21 at 8:22 Am by video and verified that I am speaking with the correct person using two identifiers. Marcus Zhang is currently located at home and no one is currently with him  during visit. The provider, Jannifer Rodney, FNP is located in their office at time of visit.  I discussed the limitations, risks, security and privacy concerns of performing an evaluation and management service by video and the availability of in person appointments. I also discussed with the patient that there may be a patient responsible charge related to this service. The patient expressed understanding and agreed to proceed.   History and Present Illness:  HPI PT presents today with complaints of rhinorrhea that started 3 weeks ago. He states the skin underneath his nose is "raw" from the drainage. He reports he took benadryl for a few days, but did not feel like it was helping. He has also applied Vaseline to the under his nose, with mild improvement.    Review of Systems  HENT: Positive for congestion. Negative for ear pain and tinnitus.   All other systems reviewed and are negative.    Observations/Objective: No SOB or distress, mild discoloration   Assessment and Plan: 1. Allergic rhinitis due to pollen, unspecified seasonality Start zyrtec and flonase Avoid allergen  Continue to apply Vaseline to skin. Avoid rubbing skin under nose.  - cetirizine (ZYRTEC) 10 MG tablet; Take 1 tablet (10 mg total) by mouth daily.   Dispense: 30 tablet; Refill: 11 - fluticasone (FLONASE) 50 MCG/ACT nasal spray; Place 2 sprays into both nostrils daily.  Dispense: 16 g; Refill: 6     I discussed the assessment and treatment plan with the patient. The patient was provided an opportunity to ask questions and all were answered. The patient agreed with the plan and demonstrated an understanding of the instructions.   The patient was advised to call back or seek an in-person evaluation if the symptoms worsen or if the condition fails to improve as anticipated.  The above assessment and management plan was discussed with the patient. The patient verbalized understanding of and has agreed to the management plan. Patient is aware to call the clinic if symptoms persist or worsen. Patient is aware when to return to the clinic for a follow-up visit. Patient educated on when it is appropriate to go to the emergency department.   Time call ended:  8:30 AM  I provided 8 minutes of   face-to-face time during this encounter.    Jannifer Rodney, FNP
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
## 2021-11-24 ENCOUNTER — Telehealth: Payer: Medicaid Other | Admitting: Physician Assistant

## 2021-11-24 NOTE — Progress Notes (Signed)
The patient no-showed for appointment despite this provider sending direct link, reaching out via phone with no response and waiting for at least 10 minutes from appointment time for patient to join. They will be marked as a NS for this appointment/time.  ? ?Raymont Andreoni Cody Jamario Colina, PA-C ? ? ? ?

## 2021-11-30 NOTE — Progress Notes (Deleted)
? ?  New Patient Office Visit ? ?Subjective:  ?Patient ID: Marcus Zhang, male    DOB: 09/21/1991  Age: 31 y.o. MRN: [id number] ? ?CC: No chief complaint on file. ? ? ?HPI ?[name] presents for new patient visit to establish care.  Introduced to Publishing rights manager role and practice setting.  All questions answered.  Discussed provider/patient relationship and expectations. ? ? ?No past medical history on file. ? ?No past surgical history on file. ? ?Family History  ?Problem Relation Age of Onset  ? Hypertension Mother   ? Healthy Father   ? ? ?Social History  ? ?Socioeconomic History  ? Marital status: Single  ?  Spouse name: Not on file  ? Number of children: Not on file  ? Years of education: Not on file  ? Highest education level: Not on file  ?Occupational History  ? Not on file  ?Tobacco Use  ? Smoking status: Former  ?  Types: Cigarettes  ?  Quit date: 08/16/2016  ?  Years since quitting: 5.2  ? Smokeless tobacco: Never  ?Vaping Use  ? Vaping Use: Never used  ?Substance and Sexual Activity  ? Alcohol use: Yes  ?  Comment: occ  ? Drug use: No  ? Sexual activity: Not on file  ?Other Topics Concern  ? Not on file  ?Social History Narrative  ? Not on file  ? ?Social Determinants of Health  ? ?Financial Resource Strain: Not on file  ?Food Insecurity: Not on file  ?Transportation Needs: Not on file  ?Physical Activity: Not on file  ?Stress: Not on file  ?Social Connections: Not on file  ?Intimate Partner Violence: Not on file  ? ? ?ROS ?Review of Systems ? ?Objective:  ? ?Today's Vitals: There were no vitals taken for this visit. ? ?Physical Exam ? ?Assessment & Plan:  ? ?Problem List Items Addressed This Visit   ?None ? ? ?Outpatient Encounter Medications as of 12/02/2021  ?Medication Sig  ? cetirizine (ZYRTEC) 10 MG tablet Take 1 tablet (10 mg total) by mouth daily.  ? fluticasone (FLONASE) 50 MCG/ACT nasal spray Place 2 sprays into both nostrils daily.  ? ?No facility-administered encounter medications on  file as of 12/02/2021.  ? ? ?Follow-up: No follow-ups on file.  ? ?Gerre Scull, NP ? ?

## 2021-12-02 ENCOUNTER — Telehealth: Payer: Medicaid Other | Admitting: Family Medicine

## 2021-12-02 ENCOUNTER — Ambulatory Visit: Payer: Self-pay | Admitting: Nurse Practitioner

## 2021-12-02 ENCOUNTER — Other Ambulatory Visit: Payer: Self-pay

## 2021-12-02 ENCOUNTER — Telehealth: Payer: Self-pay | Admitting: Nurse Practitioner

## 2021-12-02 DIAGNOSIS — R21 Rash and other nonspecific skin eruption: Secondary | ICD-10-CM

## 2021-12-02 MED ORDER — PREDNISONE 10 MG (21) PO TBPK: ORAL_TABLET | ORAL | 0 refills | Status: DC

## 2021-12-02 MED ORDER — SULFAMETHOXAZOLE-TRIMETHOPRIM 800-160 MG PO TABS: 1.0000 | ORAL_TABLET | Freq: Two times a day (BID) | ORAL | 0 refills | Status: AC

## 2021-12-02 NOTE — Patient Instructions (Signed)
Rash, Adult °A rash is a change in the color of your skin. A rash can also change the way your skin feels. There are many different conditions and factors that can cause a rash. Some rashes may disappear after a few days, but some may last for a few weeks. Common causes of rashes include: °Viral infections, such as: °Colds. °Measles. °Hand, foot, and mouth disease. °Bacterial infections, such as: °Scarlet fever. °Impetigo. °Fungal infections, such as Candida. °Allergic reactions to food, medicines, or skin care products. °Follow these instructions at home: °The goal of treatment is to stop the itching and keep the rash from spreading. Pay attention to any changes in your symptoms. Follow these instructions to help with your condition: °Medicine °Take or apply over-the-counter and prescription medicines only as told by your health care provider. These may include: °Corticosteroid creams to treat red or swollen skin. °Anti-itch lotions. °Oral allergy medicines (antihistamines). °Oral corticosteroids for severe symptoms. ° °Skin care °Apply cool compresses to the affected areas. °Do not scratch or rub your skin. °Avoid covering the rash. Make sure the rash is exposed to air as much as possible. °Managing itching and discomfort °Avoid hot showers or baths, which can make itching worse. A cold shower may help. °Try taking a bath with: °Epsom salts. Follow manufacturer instructions on the packaging. You can get these at your local pharmacy or grocery store. °Baking soda. Pour a small amount into the bath as told by your health care provider. °Colloidal oatmeal. Follow manufacturer instructions on the packaging. You can get this at your local pharmacy or grocery store. °Try applying baking soda paste to your skin. Stir water into baking soda until it reaches a paste-like consistency. °Try applying calamine lotion. This is an over-the-counter lotion that helps to relieve itchiness. °Keep cool and out of the sun. Sweating  and being hot can make itching worse. °General instructions ° °Rest as needed. °Drink enough fluid to keep your urine pale yellow. °Wear loose-fitting clothing. °Avoid scented soaps, detergents, and perfumes. Use gentle soaps, detergents, perfumes, and other cosmetic products. °Avoid any substance that causes your rash. Keep a journal to help track what causes your rash. Write down: °What you eat. °What cosmetic products you use. °What you drink. °What you wear. This includes jewelry. °Keep all follow-up visits as told by your health care provider. This is important. °Contact a health care provider if: °You sweat at night. °You lose weight. °You urinate more than normal. °You urinate less than normal, or you notice that your urine is a darker color than usual. °You feel weak. °You vomit. °Your skin or the whites of your eyes look yellow (jaundice). °Your skin: °Tingles. °Is numb. °Your rash: °Does not go away after several days. °Gets worse. °You are: °Unusually thirsty. °More tired than normal. °You have: °New symptoms. °Pain in your abdomen. °A fever. °Diarrhea. °Get help right away if you: °Have a fever and your symptoms suddenly get worse. °Develop confusion. °Have a severe headache or a stiff neck. °Have severe joint pains or stiffness. °Have a seizure. °Develop a rash that covers all or most of your body. The rash may or may not be painful. °Develop blisters that: °Are on top of the rash. °Grow larger or grow together. °Are painful. °Are inside your nose or mouth. °Develop a rash that: °Looks like purple pinprick-sized spots all over your body. °Has a "bull's eye" or looks like a target. °Is not related to sun exposure, is red and painful, and causes   your skin to peel. °Summary °A rash is a change in the color of your skin. Some rashes disappear after a few days, but some may last for a few weeks. °The goal of treatment is to stop the itching and keep the rash from spreading. °Take or apply over-the-counter  and prescription medicines only as told by your health care provider. °Contact a health care provider if you have new or worsening symptoms. °Keep all follow-up visits as told by your health care provider. This is important. °This information is not intended to replace advice given to you by your health care provider. Make sure you discuss any questions you have with your health care provider. °Document Revised: 01/03/2019 Document Reviewed: 04/15/2018 °Elsevier Patient Education © 2022 [employer] ° °

## 2021-12-02 NOTE — Telephone Encounter (Signed)
I did not send him a No Show letter.  ?

## 2021-12-02 NOTE — Telephone Encounter (Signed)
Pt came in for NP app and I let him know he would be a self pay because he only has Medicaid Family. I informed him he would need to pay and we do not bill. He did reschedule when he would have money 12/09/21. I did call him 2 times and leave a vm and I sent him a mychart message letting him know this info beforehand.  ?

## 2021-12-02 NOTE — Progress Notes (Signed)
?Virtual Visit Consent  ? ?Marcus Zhang, you are scheduled for a virtual visit with a [facility] provider today.   ?  ?Just as with appointments in the office, your consent must be obtained to participate.  Your consent will be active for this visit and any virtual visit you may have with one of our providers in the next 365 days.   ?  ?If you have a MyChart account, a copy of this consent can be sent to you electronically.  All virtual visits are billed to your insurance company just like a traditional visit in the office.   ? ?As this is a virtual visit, video technology does not allow for your provider to perform a traditional examination.  This may limit your provider's ability to fully assess your condition.  If your provider identifies any concerns that need to be evaluated in person or the need to arrange testing (such as labs, EKG, etc.), we will make arrangements to do so.   ?  ?Although advances in technology are sophisticated, we cannot ensure that it will always work on either your end or our end.  If the connection with a video visit is poor, the visit may have to be switched to a telephone visit.  With either a video or telephone visit, we are not always able to ensure that we have a secure connection.    ? ?I need to obtain your verbal consent now.   Are you willing to proceed with your visit today?  ?  ?Marcus Zhang has provided verbal consent on 12/02/2021 for a virtual visit (video or telephone). ?  ?Marcus Zhang  ? ?Date: 12/02/2021 12:59 PM ? ? ?Virtual Visit via Video Note  ? ?Marcus Zhang, connected with  Marcus Zhang  ([id number], 22-Feb-1991) on 12/02/21 at  1:00 PM EST by a video-enabled telemedicine application and verified that I am speaking with the correct person using two identifiers. ? ?Location: ?Patient: Virtual Visit Location Patient: Home ?Provider: Virtual Visit Location Provider: Home Office ?  ?I discussed the limitations of evaluation and management by  telemedicine and the availability of in person appointments. The patient expressed understanding and agreed to proceed.   ? ?History of Present Illness: ?Marcus Zhang is a 31 y.o. who identifies as a male who was assigned male at birth, and is being seen today for rash on body. Nothing new with food or hygiene products or cleaning items. Family that he lives with does not have rash- does not appear contagious due to this. ? ?HPI: Rash ?This is a new problem. The current episode started 1 to 4 weeks ago. The problem has been gradually worsening since onset. The affected locations include the face, neck, left hand and abdomen. The rash is characterized by blistering, draining, itchiness and peeling. He was exposed to nothing. Past treatments include moisturizer. The treatment provided no relief.   ?Problems:  ?Patient Active Problem List  ? Diagnosis Date Noted  ? WEIGHT LOSS 01/09/2008  ? OBESITY 06/16/2007  ? TOBACCO ABUSE 06/16/2007  ? BEHAVIOR PROBLEM 06/16/2007  ? Pure hypercholesterolemia 12/14/2006  ? MARIJUANA ABUSE 12/14/2006  ?  ?Allergies: No Known Allergies ?Medications:  ?Current Outpatient Medications:  ?  cetirizine (ZYRTEC) 10 MG tablet, Take 1 tablet (10 mg total) by mouth daily., Disp: 30 tablet, Rfl: 11 ?  fluticasone (FLONASE) 50 MCG/ACT nasal spray, Place 2 sprays into both nostrils daily., Disp: 16 g, Rfl: 6 ? ?Observations/Objective: ?Patient  is well-developed, well-nourished in no acute distress.  ?Resting comfortably  at home.  ?Head is normocephalic, atraumatic.  ?No labored breathing.  ?Speech is clear and coherent with logical content.  ?Patient is alert and oriented at baseline.  ? ? ?Assessment and Plan: ? ?1. Rash and nonspecific skin eruption ?S&S consistent with questionable impetigo or staph like rash/infection ?Will cover for MRSA as well, as prednisone to calm rash and help with itching ? ?Encouraged to look back and see if allergy to something new ? ?- predniSONE (STERAPRED  UNI-PAK 21 TAB) 10 MG (21) TBPK tablet; Take as directed  Dispense: 21 tablet; Refill: 0 ?- sulfamethoxazole-trimethoprim (BACTRIM DS) 800-160 MG tablet; Take 1 tablet by mouth 2 (two) times daily for 7 days.  Dispense: 14 tablet; Refill: 0 ? ? Reviewed side effects, risks and benefits of medication.   ? ?Patient acknowledged agreement and understanding of the plan.  ? ?Strict in person follow reviewed for worsening or failed improvement with meds. ? ?Follow Up Instructions: ?I discussed the assessment and treatment plan with the patient. The patient was provided an opportunity to ask questions and all were answered. The patient agreed with the plan and demonstrated an understanding of the instructions.  A copy of instructions were sent to the patient via MyChart unless otherwise noted below.  ? ?The patient was advised to call back or seek an in-person evaluation if the symptoms worsen or if the condition fails to improve as anticipated. ? ?Time:  ?I spent 10 minutes with the patient via telehealth technology discussing the above problems/concerns.   ? ?Marcus Zhang ? ?

## 2021-12-09 NOTE — Progress Notes (Deleted)
? ?  New Patient Office Visit ? ?Subjective:  ?Patient ID: Marcus Zhang, male    DOB: 30-Jul-1991  Age: 31 y.o. MRN: [id number] ? ?CC: No chief complaint on file. ? ? ?HPI ?Marcus Zhang presents for new patient visit to establish care.  Introduced to Designer, jewellery role and practice setting.  All questions answered.  Discussed provider/patient relationship and expectations. ? ? ?No past medical history on file. ? ?No past surgical history on file. ? ?Family History  ?Problem Relation Age of Onset  ? Hypertension Mother   ? Healthy Father   ? ? ?Social History  ? ?Socioeconomic History  ? Marital status: Single  ?  Spouse name: Not on file  ? Number of children: Not on file  ? Years of education: Not on file  ? Highest education level: Not on file  ?Occupational History  ? Not on file  ?Tobacco Use  ? Smoking status: Former  ?  Types: Cigarettes  ?  Quit date: 08/16/2016  ?  Years since quitting: 5.3  ? Smokeless tobacco: Never  ?Vaping Use  ? Vaping Use: Never used  ?Substance and Sexual Activity  ? Alcohol use: Yes  ?  Comment: occ  ? Drug use: No  ? Sexual activity: Not on file  ?Other Topics Concern  ? Not on file  ?Social History Narrative  ? Not on file  ? ?Social Determinants of Health  ? ?Financial Resource Strain: Not on file  ?Food Insecurity: Not on file  ?Transportation Needs: Not on file  ?Physical Activity: Not on file  ?Stress: Not on file  ?Social Connections: Not on file  ?Intimate Partner Violence: Not on file  ? ? ?ROS ?Review of Systems ? ?Objective:  ? ?Today's Vitals: There were no vitals taken for this visit. ? ?Physical Exam ? ?Assessment & Plan:  ? ?Problem List Items Addressed This Visit   ?None ? ? ?Outpatient Encounter Medications as of 12/12/2021  ?Medication Sig  ? cetirizine (ZYRTEC) 10 MG tablet Take 1 tablet (10 mg total) by mouth daily.  ? fluticasone (FLONASE) 50 MCG/ACT nasal spray Place 2 sprays into both nostrils daily.  ? predniSONE (STERAPRED UNI-PAK 21 TAB) 10 MG (21)  TBPK tablet Take as directed  ? sulfamethoxazole-trimethoprim (BACTRIM DS) 800-160 MG tablet Take 1 tablet by mouth 2 (two) times daily for 7 days.  ? ?No facility-administered encounter medications on file as of 12/12/2021.  ? ? ?Follow-up: No follow-ups on file.  ? ?Charyl Dancer, NP ? ?

## 2021-12-12 ENCOUNTER — Telehealth: Payer: Self-pay | Admitting: Nurse Practitioner

## 2021-12-12 ENCOUNTER — Ambulatory Visit: Payer: Medicaid Other | Admitting: Nurse Practitioner

## 2021-12-12 NOTE — Telephone Encounter (Signed)
Pt did not show for NP app with Lauren on 12/12/21. I sent a No Show letter. ?

## 2021-12-13 ENCOUNTER — Encounter: Payer: Self-pay | Admitting: Nurse Practitioner

## 2021-12-13 NOTE — Telephone Encounter (Signed)
Pt was no show on 12/12/2021 as well - pt has been dismissed ?

## 2021-12-13 NOTE — Telephone Encounter (Signed)
Noted  

## 2022-03-29 ENCOUNTER — Encounter (HOSPITAL_COMMUNITY): Payer: Self-pay | Admitting: Emergency Medicine

## 2022-03-29 ENCOUNTER — Emergency Department (HOSPITAL_COMMUNITY)
Admission: EM | Admit: 2022-03-29 | Discharge: 2022-03-30 | Disposition: A | Discharge status: Home / Self Care | Payer: Medicaid Other | Attending: Emergency Medicine | Admitting: Emergency Medicine

## 2022-03-29 DIAGNOSIS — R040 Epistaxis: Secondary | ICD-10-CM | POA: Insufficient documentation

## 2022-03-29 LAB — CBC WITH DIFFERENTIAL/PLATELET
Abs Immature Granulocytes: 0.04 10*3/uL (ref 0.00–0.07)
Basophils Absolute: 0.1 10*3/uL (ref 0.0–0.1)
Basophils Relative: 1 %
Eosinophils Absolute: 0.1 10*3/uL (ref 0.0–0.5)
Eosinophils Relative: 1 %
HCT: 46.5 % (ref 39.0–52.0)
Hemoglobin: 14.8 g/dL (ref 13.0–17.0)
Immature Granulocytes: 0 %
Lymphocytes Relative: 24 %
Lymphs Abs: 2.5 10*3/uL (ref 0.7–4.0)
MCH: 29.2 pg (ref 26.0–34.0)
MCHC: 31.8 g/dL (ref 30.0–36.0)
MCV: 91.7 fL (ref 80.0–100.0)
Monocytes Absolute: 0.8 10*3/uL (ref 0.1–1.0)
Monocytes Relative: 8 %
Neutro Abs: 7 10*3/uL (ref 1.7–7.7)
Neutrophils Relative %: 66 %
Platelets: 463 10*3/uL — ABNORMAL HIGH (ref 150–400)
RBC: 5.07 MIL/uL (ref 4.22–5.81)
RDW: 14.3 % (ref 11.5–15.5)
WBC: 10.5 10*3/uL (ref 4.0–10.5)
nRBC: 0 % (ref 0.0–0.2)

## 2022-03-29 NOTE — ED Provider Triage Note (Signed)
Emergency Medicine Provider Triage Evaluation Note  Marcus Zhang , a 31 y.o. male  was evaluated in triage.  Pt complains of nosebleeds and nose pain.  Patient reports he had been using cocaine frequently and stopped about a month ago.  For the past 3 weeks he has been having swelling and pain of the nose with frequent bloody drainage.  No sinus pain or pressure, reports he has felt sick with some chills but has not checked his temperature.  No chest pain, shortness of breath, nausea or vomiting..  Review of Systems  Positive: Nosebleeds, nose pain Negative: Fevers, chest pain, vomiting  Physical Exam  BP (!) 150/103   Pulse 92   Temp 98 F (36.7 C)   Resp 16   Ht 5\' 6"  (1.676 m)   Wt 104.3 kg   SpO2 97%   BMI 37.12 kg/m  Gen:   Awake, no distress   Resp:  Normal effort  MSK:   Moves extremities without difficulty  Other:  Bilateral naris with irritation and erythema, and bloody purulent drainage  Medical Decision Making  Medically screening exam initiated at 11:16 PM.  Appropriate orders placed.  Marcus Zhang was informed that the remainder of the evaluation will be completed by another provider, this initial triage assessment does not replace that evaluation, and the importance of remaining in the ED until their evaluation is complete.     [name], Dartha Lodge 03/29/22 2317

## 2022-03-29 NOTE — ED Triage Notes (Signed)
Pt reports constant nose bleeds X3 weeks.  Reports he stopped using cocaine 1 month ago.

## 2022-03-30 LAB — BASIC METABOLIC PANEL
Anion gap: 11 (ref 5–15)
BUN: 8 mg/dL (ref 6–20)
CO2: 23 mmol/L (ref 22–32)
Calcium: 9.5 mg/dL (ref 8.9–10.3)
Chloride: 106 mmol/L (ref 98–111)
Creatinine, Ser: 0.98 mg/dL (ref 0.61–1.24)
GFR, Estimated: >60 mL/min (ref >60)
Glucose, Bld: 98 mg/dL (ref 70–99)
Potassium: 4.1 mmol/L (ref 3.5–5.1)
Sodium: 140 mmol/L (ref 135–145)

## 2022-03-30 MED ORDER — OXYMETAZOLINE HCL 0.05 % NA SOLN: 2.0000 | Freq: Two times a day (BID) | NASAL | 0 refills | Status: AC

## 2022-03-30 MED ORDER — AMOXICILLIN-POT CLAVULANATE 875-125 MG PO TABS: 1.0000 | ORAL_TABLET | Freq: Two times a day (BID) | ORAL | 0 refills | Status: AC

## 2022-03-30 NOTE — ED Provider Notes (Addendum)
[facility] EMERGENCY DEPARTMENT Provider Note   CSN: [id number] Arrival date & time: 03/29/22  2204     History  Chief Complaint  Patient presents with   Epistaxis    Marcus Zhang is a 31 y.o. male.  31 y.o male with a PMH of cocaine use for 3 years presents to the ED with a chief complaint of bilateral nose drainage x 1 month. Patient reports stopping cocaine use approximately 3 weeks ago, but had been using cocaine every night for 3 years. There is pain around the sinus, worst with palpation. There is constant drainage or mucopurulent drainage mixed with blood. He states symptoms are exacerbate being indoors. Patient has tried applying "eczema cream up his nose". There is no fever, no shortness of breath or other complaints.   The history is provided by the patient.  Epistaxis Location:  Bilateral Severity:  Moderate Duration:  3 weeks Timing:  Constant Progression:  Waxing and waning Chronicity:  New Context: drug use   Context: not anticoagulants, not foreign body, not hypertension, not recent infection and not thrombocytopenia   Relieved by:  Nothing Worsened by:  Movement Ineffective treatments:  None tried Associated symptoms: congestion, facial pain, sinus pain and sneezing   Associated symptoms: no dizziness, no fever and no headaches   Risk factors: no alcohol use and no liver disease        Home Medications Prior to Admission medications   Medication Sig Start Date End Date Taking? Authorizing Provider  amoxicillin-clavulanate (AUGMENTIN) 875-125 MG tablet Take 1 tablet by mouth 2 (two) times daily for 7 days. 03/30/22 04/06/22 Yes Lebaron Bautch, Leonie Douglas, PA-C  oxymetazoline (AFRIN NASAL SPRAY) 0.05 % nasal spray Place 2 sprays into both nostrils 2 (two) times daily for 7 days. 03/30/22 04/06/22 Yes Barbette Mcglaun, Leonie Douglas, PA-C  cetirizine (ZYRTEC) 10 MG tablet Take 1 tablet (10 mg total) by mouth daily. 01/05/21   Junie Spencer, FNP  fluticasone (FLONASE) 50  MCG/ACT nasal spray Place 2 sprays into both nostrils daily. 01/05/21   Junie Spencer, FNP  predniSONE (STERAPRED UNI-PAK 21 TAB) 10 MG (21) TBPK tablet Take as directed 12/02/21   Freddy Finner, NP      Allergies    Patient has no known allergies.    Review of Systems   Review of Systems  Constitutional:  Negative for fever.  HENT:  Positive for congestion, nosebleeds, sinus pain and sneezing.   Respiratory:  Negative for shortness of breath.   Cardiovascular:  Negative for chest pain.  Gastrointestinal:  Negative for abdominal pain.  Genitourinary:  Negative for flank pain.  Neurological:  Negative for dizziness and headaches.  All other systems reviewed and are negative.   Physical Exam Updated Vital Signs BP 129/90 (BP Location: Right Arm)   Pulse 88   Temp 98 F (36.7 C)   Resp 16   Ht 5\' 6"  (1.676 m)   Wt 104.3 kg   SpO2 100%   BMI 37.12 kg/m  Physical Exam Vitals and nursing note reviewed.  Constitutional:      Appearance: Normal appearance. He is not ill-appearing.  HENT:     Head: Normocephalic and atraumatic.     Nose: Congestion present.     Right Nostril: Epistaxis present. No foreign body or occlusion.     Left Nostril: Epistaxis present. No foreign body or occlusion.     Right Turbinates: Enlarged.     Left Turbinates: Enlarged.     Right Sinus: Maxillary  sinus tenderness present.     Left Sinus: Maxillary sinus tenderness present.     Comments: Mucopurulent discharge noted with on active bleeding. No foreign body or occlusion noted. No blood in the oropharynx noted.  Eyes:     Pupils: Pupils are equal, round, and reactive to light.  Cardiovascular:     Rate and Rhythm: Normal rate.  Pulmonary:     Effort: Pulmonary effort is normal.  Abdominal:     General: Abdomen is flat.  Musculoskeletal:     Cervical back: Normal range of motion and neck supple.  Skin:    General: Skin is warm and dry.  Neurological:     Mental Status: He is alert and  oriented to person, place, and time.     ED Results / Procedures / Treatments   Labs (all labs ordered are listed, but only abnormal results are displayed) Labs Reviewed  CBC WITH DIFFERENTIAL/PLATELET - Abnormal; Notable for the following components:      Result Value   Platelets 463 (*)    All other components within normal limits  BASIC METABOLIC PANEL    EKG None  Radiology No results found.  Procedures Procedures    Medications Ordered in ED Medications - No data to display  ED Course/ Medical Decision Making/ A&P                           Medical Decision Making Risk OTC drugs. Prescription drug management.    Patient here with epic status for the past month.  He reports cocaine use for the past 3 years, nightly.  He does not have any prior medical history, denies any alcohol abuse, no prior history of thrombocytopenia.  Evaluation there is pain along the maxillary sinus, the nares appear erythematous, enlarged.  Active drainage with mucopurulent discharge but no active bleeding.  Local adenopathy noted.  No blood visualized along the oropharynx.  Considered perforation of the septum versus posterior bleed.  Blood work was ordered for him in triage.  Electrolytes are within normal limits on BMP.  CBC with a normal hemoglobin, platelets are more or less slightly elevated.  All other derangement noted.  His vitals are within normal limits.  I discussed with patient antibiotic therapy, Afrin for relief in symptoms.  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  Is agreeable to plan and treatment, patient stable for discharge.    Portions of this note were generated with Scientist, clinical (histocompatibility and immunogenetics). Dictation errors may occur despite best attempts at proofreading.   Final Clinical Impression(s) / ED Diagnoses Final diagnoses:  Epistaxis    Rx / DC Orders ED Discharge Orders          Ordered     amoxicillin-clavulanate (AUGMENTIN) 875-125 MG tablet  2 times daily        03/30/22 0705    oxymetazoline (AFRIN NASAL SPRAY) 0.05 % nasal spray  2 times daily        03/30/22 0713              Claude Manges, PA-C 03/30/22 0713    Claude Manges, PA-C 03/30/22 0714    Melene Plan, DO 03/31/22 3419

## 2022-03-30 NOTE — ED Notes (Signed)
Pt shown to lobby. Verbalized understanding discharge instructions, prescriptions, and follow-up. In no acute distress.  Pt provided ice water at d/c per request.

## 2022-03-30 NOTE — Discharge Instructions (Addendum)
I have prescribed antibiotics in order to help prevent any further infection.  Please take 1 tablet twice a day for the next 7 days.  You will need to follow-up with our ENT specialist, her phone number is attached to your chart.  If you experience worsening symptoms you will need to return to emergency department.

## 2023-02-15 ENCOUNTER — Ambulatory Visit (HOSPITAL_COMMUNITY)
Admission: RE | Admit: 2023-02-15 | Discharge: 2023-02-15 | Disposition: A | Discharge status: Home / Self Care | Payer: Medicaid Other | Source: Ambulatory Visit | Attending: Emergency Medicine | Admitting: Emergency Medicine

## 2023-02-15 ENCOUNTER — Encounter (HOSPITAL_COMMUNITY): Payer: Self-pay

## 2023-02-15 VITALS — BP 151/111 | HR 98 | Temp 98.7°F | Resp 18

## 2023-02-15 DIAGNOSIS — Z202 Contact with and (suspected) exposure to infections with a predominantly sexual mode of transmission: Secondary | ICD-10-CM

## 2023-02-15 DIAGNOSIS — H9202 Otalgia, left ear: Secondary | ICD-10-CM | POA: Diagnosis present

## 2023-02-15 DIAGNOSIS — J301 Allergic rhinitis due to pollen: Secondary | ICD-10-CM

## 2023-02-15 DIAGNOSIS — Z113 Encounter for screening for infections with a predominantly sexual mode of transmission: Secondary | ICD-10-CM

## 2023-02-15 LAB — HIV ANTIBODY (ROUTINE TESTING W REFLEX): HIV Screen 4th Generation wRfx: NONREACTIVE

## 2023-02-15 MED ORDER — CETIRIZINE HCL 10 MG PO TABS: 10.0000 mg | ORAL_TABLET | Freq: Every day | ORAL | 2 refills | Status: AC

## 2023-02-15 MED ORDER — FLUTICASONE PROPIONATE 50 MCG/ACT NA SUSP: 2.0000 | Freq: Every day | NASAL | 2 refills | Status: AC

## 2023-02-15 NOTE — ED Provider Notes (Signed)
[facility]    CSN: [id number] Arrival date & time: 02/15/23  1301     History   Chief Complaint Chief Complaint  Patient presents with   Otalgia   SEXUALLY TRANSMITTED DISEASE    HPI HAIDON CHIRIBOGA is a 32 y.o. male.  Presents for STD testing.  Reports he may have been exposed to syphilis but unsure.  Not having any symptoms.  Would like all testing today.  Also about a month of left ear pain.  On and off.  Has not taken any medicines.  No fever, runny nose, congestion, drainage, hearing changes.  History reviewed. No pertinent past medical history.  Patient Active Problem List   Diagnosis Date Noted   WEIGHT LOSS 01/09/2008   OBESITY 06/16/2007   TOBACCO ABUSE 06/16/2007   BEHAVIOR PROBLEM 06/16/2007   Pure hypercholesterolemia 12/14/2006   MARIJUANA ABUSE 12/14/2006    History reviewed. No pertinent surgical history.     Home Medications    Prior to Admission medications   Medication Sig Start Date End Date Taking? Authorizing Provider  cetirizine (ZYRTEC ALLERGY) 10 MG tablet Take 1 tablet (10 mg total) by mouth daily. 02/15/23  Yes Roczen Waymire, Lurena Joiner, PA-C  fluticasone (FLONASE) 50 MCG/ACT nasal spray Place 2 sprays into both nostrils daily. 02/15/23  Yes Omario Ander, Lurena Joiner, PA-C    Family History Family History  Problem Relation Age of Onset   Hypertension Mother    Healthy Father     Social History Social History   Tobacco Use   Smoking status: Former    Types: Cigarettes    Quit date: 08/16/2016    Years since quitting: 6.5   Smokeless tobacco: Never  Vaping Use   Vaping Use: Never used  Substance Use Topics   Alcohol use: Yes    Comment: occ   Drug use: No     Allergies   Patient has no known allergies.   Review of Systems Review of Systems As per HPI  Physical Exam Triage Vital Signs ED Triage Vitals  Enc Vitals Group     BP 02/15/23 1318 (!) 151/111     Pulse Rate 02/15/23 1318 98     Resp 02/15/23 1318 18     Temp  02/15/23 1318 98.7 F (37.1 C)     Temp Source 02/15/23 1318 Oral     SpO2 02/15/23 1318 98 %     Weight --      Height --      Head Circumference --      Peak Flow --      Pain Score 02/15/23 1316 8     Pain Loc --      Pain Edu? --      Excl. in GC? --    No data found.  Updated Vital Signs BP (!) 151/111 (BP Location: Right Arm)   Pulse 98   Temp 98.7 F (37.1 C) (Oral)   Resp 18   SpO2 98%     Physical Exam Vitals and nursing note reviewed.  Constitutional:      General: He is not in acute distress.    Appearance: Normal appearance.  HENT:     Right Ear: Tympanic membrane and ear canal normal.     Ears:     Comments: Clear fluid behind left TM.  Appears full.    Mouth/Throat:     Pharynx: Oropharynx is clear.  Cardiovascular:     Rate and Rhythm: Normal rate and regular rhythm.  Pulses: Normal pulses.     Heart sounds: Normal heart sounds.  Pulmonary:     Effort: Pulmonary effort is normal.     Breath sounds: Normal breath sounds.  Neurological:     Mental Status: He is alert and oriented to person, place, and time.     UC Treatments / Results  Labs (all labs ordered are listed, but only abnormal results are displayed) Labs Reviewed  HIV ANTIBODY (ROUTINE TESTING W REFLEX)  RPR  CYTOLOGY, (ORAL, ANAL, URETHRAL) ANCILLARY ONLY    EKG  Radiology No results found.  Procedures Procedures   Medications Ordered in UC Medications - No data to display  Initial Impression / Assessment and Plan / UC Course  I have reviewed the triage vital signs and the nursing notes.  Pertinent labs & imaging results that were available during my care of the patient were reviewed by me and considered in my medical decision making (see chart for details).  Cytology swab along with HIV and RPR pending.  Treat positive result if indicated.  Patient has MyChart and will be able to follow his results.  For ear pain suspect eustachian tube dysfunction.  Recommend once  daily allergy med and nasal spray.  Can return if needed.  No questions at this time  Final Clinical Impressions(s) / UC Diagnoses   Final diagnoses:  Screen for STD (sexually transmitted disease)  Possible exposure to STD  Left ear pain     Discharge Instructions      We will call you if anything on your swab or blood work returns positive.  You will also be able to see these results on MyChart.  Please abstain from sexual intercourse until your results return.  For the ear pain I recommend trying once daily allergy med such as Zyrtec, in combination with a nasal spray like Flonase.  This should help decrease pressure and fluid behind the ear. Please return if needed!    ED Prescriptions     Medication Sig Dispense Auth. Provider   cetirizine (ZYRTEC ALLERGY) 10 MG tablet Take 1 tablet (10 mg total) by mouth daily. 30 tablet Saroya Riccobono, PA-C   fluticasone (FLONASE) 50 MCG/ACT nasal spray Place 2 sprays into both nostrils daily. 9.9 mL Ashly Goethe, Lurena Joiner, PA-C      PDMP not reviewed this encounter.   Chananya Canizalez, Lurena Joiner, [place] 02/15/23 1344

## 2023-02-15 NOTE — Discharge Instructions (Addendum)
We will call you if anything on your swab or blood work returns positive.  You will also be able to see these results on MyChart.  Please abstain from sexual intercourse until your results return.  For the ear pain I recommend trying once daily allergy med such as Zyrtec, in combination with a nasal spray like Flonase.  This should help decrease pressure and fluid behind the ear. Please return if needed!

## 2023-02-15 NOTE — ED Triage Notes (Signed)
Pt states he was possibly exposed to syphilis and would like to be tested. He denies any sx.   Pt states he is having left ear pain x 1 month, he isnt taking nay meds for sx.

## 2023-02-16 LAB — CYTOLOGY, (ORAL, ANAL, URETHRAL) ANCILLARY ONLY
Chlamydia: NEGATIVE
Comment: NEGATIVE
Comment: NEGATIVE
Comment: NORMAL
Neisseria Gonorrhea: NEGATIVE
Trichomonas: NEGATIVE

## 2023-02-16 LAB — RPR
RPR Ser Ql: REACTIVE — AB
RPR Titer: 1:32 {titer}

## 2023-02-20 LAB — T.PALLIDUM AB, TOTAL: T Pallidum Abs: REACTIVE — AB

## 2023-02-21 ENCOUNTER — Telehealth (HOSPITAL_COMMUNITY): Payer: Self-pay | Admitting: Emergency Medicine

## 2023-02-21 NOTE — Telephone Encounter (Signed)
T. Pall is positive.  Per protocol, patient will need treatment with IM Bicillin 2.4 million units for positive Syphilis.   Attempted to reach patient x 1, unable to LVM on home or mobile [facility] notified

## 2023-02-22 ENCOUNTER — Ambulatory Visit (HOSPITAL_COMMUNITY)
Admission: RE | Admit: 2023-02-22 | Discharge: 2023-02-22 | Disposition: A | Discharge status: Home / Self Care | Payer: Medicaid Other | Source: Ambulatory Visit | Attending: Internal Medicine | Admitting: Internal Medicine

## 2023-02-22 ENCOUNTER — Encounter (HOSPITAL_COMMUNITY): Payer: Self-pay

## 2023-02-22 VITALS — BP 140/88 | HR 80 | Temp 98.0°F | Resp 18 | Wt 240.0 lb

## 2023-02-22 DIAGNOSIS — H9202 Otalgia, left ear: Secondary | ICD-10-CM | POA: Diagnosis not present

## 2023-02-22 DIAGNOSIS — A539 Syphilis, unspecified: Secondary | ICD-10-CM

## 2023-02-22 MED ORDER — PENICILLIN G BENZATHINE 1200000 UNIT/2ML IM SUSY
PREFILLED_SYRINGE | INTRAMUSCULAR | Status: AC
  Filled 2023-02-22: qty 4

## 2023-02-22 MED ORDER — GUAIFENESIN ER 600 MG PO TB12: 600.0000 mg | ORAL_TABLET | Freq: Two times a day (BID) | ORAL | 0 refills | Status: AC

## 2023-02-22 MED ORDER — PENICILLIN G BENZATHINE 1200000 UNIT/2ML IM SUSY
2.4000 10*6.[IU] | PREFILLED_SYRINGE | Freq: Once | INTRAMUSCULAR | Status: AC
  Administered 2023-02-22: 2.4 10*6.[IU] via INTRAMUSCULAR

## 2023-02-22 NOTE — Discharge Instructions (Addendum)
Please avoid sexual intercourse for 7 days to allow the medications to work Safe sex practices advised. Please take ibuprofen as needed for pain Your ear exam shows you have middle ear effusion but no ear infection. Please take Mucinex as needed to help with middle ear effusion Return to urgent care if you have any other concerns.

## 2023-02-22 NOTE — ED Triage Notes (Signed)
Patient is here for treatment for RPR, pt states that ear is still hurting. Taking allergy meds as told that's not helping. Left ear.

## 2023-02-22 NOTE — ED Provider Notes (Signed)
[facility]    CSN: [id number] Arrival date & time: 02/22/23  1742      History   Chief Complaint Chief Complaint  Patient presents with   Exposure to STD    Already had test just need treatment - Entered by patient    HPI Marcus Zhang is a 32 y.o. male comes to the urgent care for treatment of syphilis.  Patient was diagnosed with cephalexin is here to get Bicillin 2.4 MU.  Patient also complains of left ear pain.  He was seen for left ear pain and was prescribed allergy medications.  Patient says that the pain is not improved and he has decreased hearing from the left ear.  No dizziness, near syncope or syncopal episode.  No fever or chills.  HPI  History reviewed. No pertinent past medical history.  Patient Active Problem List   Diagnosis Date Noted   WEIGHT LOSS 01/09/2008   OBESITY 06/16/2007   TOBACCO ABUSE 06/16/2007   BEHAVIOR PROBLEM 06/16/2007   Pure hypercholesterolemia 12/14/2006   MARIJUANA ABUSE 12/14/2006    History reviewed. No pertinent surgical history.     Home Medications    Prior to Admission medications   Medication Sig Start Date End Date Taking? Authorizing Provider  cetirizine (ZYRTEC ALLERGY) 10 MG tablet Take 1 tablet (10 mg total) by mouth daily. 02/15/23  Yes Rising, Lurena Joiner, PA-C  fluticasone (FLONASE) 50 MCG/ACT nasal spray Place 2 sprays into both nostrils daily. 02/15/23  Yes Rising, Lurena Joiner, PA-C  guaiFENesin (MUCINEX) 600 MG 12 hr tablet Take 1 tablet (600 mg total) by mouth 2 (two) times daily for 7 days. 02/22/23 03/01/23 Yes Revere Maahs, Britta Mccreedy, MD    Family History Family History  Problem Relation Age of Onset   Hypertension Mother    Healthy Father     Social History Social History   Tobacco Use   Smoking status: Former    Types: Cigarettes    Quit date: 08/16/2016    Years since quitting: 6.5   Smokeless tobacco: Never  Vaping Use   Vaping Use: Never used  Substance Use Topics   Alcohol use: Yes     Comment: occ   Drug use: No     Allergies   Patient has no known allergies.   Review of Systems Review of Systems As per HPI  Physical Exam Triage Vital Signs ED Triage Vitals  Enc Vitals Group     BP 02/22/23 1758 (!) 140/88     Pulse Rate 02/22/23 1758 80     Resp 02/22/23 1758 18     Temp 02/22/23 1758 98 F (36.7 C)     Temp Source 02/22/23 1758 Oral     SpO2 02/22/23 1758 95 %     Weight 02/22/23 1757 240 lb (108.9 kg)     Height --      Head Circumference --      Peak Flow --      Pain Score 02/22/23 1757 6     Pain Loc --      Pain Edu? --      Excl. in GC? --    No data found.  Updated Vital Signs BP (!) 140/88 (BP Location: Left Arm)   Pulse 80   Temp 98 F (36.7 C) (Oral)   Resp 18   Wt 108.9 kg   SpO2 95%   BMI 38.74 kg/m   Visual Acuity Right Eye Distance:   Left Eye Distance:   Bilateral  Distance:    Right Eye Near:   Left Eye Near:    Bilateral Near:     Physical Exam Vitals and nursing note reviewed.  HENT:     Right Ear: Tympanic membrane, ear canal and external ear normal.     Left Ear: Tympanic membrane, ear canal and external ear normal.     Nose:     Comments: Ulcer involving the right nare.  No surrounding erythema.  No nasal bridge deformity. Cardiovascular:     Rate and Rhythm: Normal rate and regular rhythm.  Pulmonary:     Effort: Pulmonary effort is normal.     Breath sounds: Normal breath sounds.  Neurological:     Mental Status: He is alert.      UC Treatments / Results  Labs (all labs ordered are listed, but only abnormal results are displayed) Labs Reviewed - No data to display  EKG   Radiology No results found.  Procedures Procedures (including critical care time)  Medications Ordered in UC Medications  penicillin g benzathine (BICILLIN LA) 1200000 UNIT/2ML injection 2.4 Million Units (2.4 Million Units Intramuscular Given 02/22/23 1915)    Initial Impression / Assessment and Plan / UC Course  I  have reviewed the triage vital signs and the nursing notes.  Pertinent labs & imaging results that were available during my care of the patient were reviewed by me and considered in my medical decision making (see chart for details).     1.  Left ear pain: Ear exam is unremarkable Mucinex twice daily Return precautions given.  2.  Syphilis: Bicillin 2,400,000 units x 1 dose Safe sex practices recommended. Final Clinical Impressions(s) / UC Diagnoses   Final diagnoses:  Syphilis in male  Left ear pain     Discharge Instructions      Please avoid sexual intercourse for 7 days to allow the medications to work Safe sex practices advised. Please take ibuprofen as needed for pain Your ear exam shows you have middle ear effusion but no ear infection. Please take Mucinex as needed to help with middle ear effusion Return to urgent care if you have any other concerns.   ED Prescriptions     Medication Sig Dispense Auth. Provider   guaiFENesin (MUCINEX) 600 MG 12 hr tablet Take 1 tablet (600 mg total) by mouth 2 (two) times daily for 7 days. 14 tablet Ermin Parisien, Britta Mccreedy, MD      PDMP not reviewed this encounter.   Merrilee Jansky, MD 02/22/23 [id number]

## 2023-03-13 ENCOUNTER — Ambulatory Visit (HOSPITAL_COMMUNITY): Payer: Medicaid Other
